# Patient Record
Sex: Male | Born: 1945 | Race: Black or African American | Hispanic: No | Marital: Married | Smoking: Former smoker
Health system: Southern US, Community
[De-identification: ages and names within clinical notes are randomized; demographics above are authoritative.]

## PROBLEM LIST (undated history)

## (undated) DIAGNOSIS — T7840XA Allergy, unspecified, initial encounter: Secondary | ICD-10-CM

## (undated) DIAGNOSIS — J302 Other seasonal allergic rhinitis: Secondary | ICD-10-CM

## (undated) DIAGNOSIS — K219 Gastro-esophageal reflux disease without esophagitis: Secondary | ICD-10-CM

## (undated) DIAGNOSIS — K635 Polyp of colon: Secondary | ICD-10-CM

## (undated) DIAGNOSIS — N529 Male erectile dysfunction, unspecified: Secondary | ICD-10-CM

## (undated) HISTORY — DX: Male erectile dysfunction, unspecified: N52.9

## (undated) HISTORY — DX: Gastro-esophageal reflux disease without esophagitis: K21.9

## (undated) HISTORY — DX: Allergy, unspecified, initial encounter: T78.40XA

## (undated) HISTORY — DX: Other seasonal allergic rhinitis: J30.2

## (undated) HISTORY — DX: Polyp of colon: K63.5

## (undated) HISTORY — PX: COLONOSCOPY: SHX174

## (undated) HISTORY — PX: OTHER SURGICAL HISTORY: SHX169

---

## 2004-10-28 ENCOUNTER — Encounter: Admission: RE | Admit: 2004-10-28 | Discharge: 2004-11-20 | Payer: Self-pay | Admitting: Neurosurgery

## 2008-09-07 ENCOUNTER — Ambulatory Visit: Payer: Self-pay | Admitting: Gastroenterology

## 2008-09-19 ENCOUNTER — Encounter: Payer: Self-pay | Admitting: Gastroenterology

## 2008-09-19 ENCOUNTER — Ambulatory Visit: Payer: Self-pay | Admitting: Gastroenterology

## 2008-09-24 ENCOUNTER — Encounter: Payer: Self-pay | Admitting: Gastroenterology

## 2010-06-19 ENCOUNTER — Encounter: Payer: Self-pay | Admitting: Pulmonary Disease

## 2010-06-19 ENCOUNTER — Ambulatory Visit (INDEPENDENT_AMBULATORY_CARE_PROVIDER_SITE_OTHER): Payer: 59 | Admitting: Pulmonary Disease

## 2010-06-19 VITALS — BP 124/90 | HR 72 | Temp 98.2°F | Ht 68.0 in | Wt 176.6 lb

## 2010-06-19 DIAGNOSIS — R05 Cough: Secondary | ICD-10-CM | POA: Insufficient documentation

## 2010-06-19 NOTE — Progress Notes (Signed)
Subjective:    Patient ID: Brian Mueller, male    DOB: 03/21/1945, 65 y.o.   MRN: [id number]  HPI 65 yo male with cough.  His cough started in February.  He had a cold at that time.  He never had a problem like this before.  He was treated with an antibiotic and cough medicine.  This helped initially.  His cough persisted.  He was then given another antibiotic and cough medicine in March.  He was scheduled for pulmonary evaluation.  He is not using anything for his cough now.  His cough has improved, but he want to keep his appointment to make sure nothing else was contributing to his cough.  He has a history of allergies. He was previously on allergy shots.  He uses flonase and allegra on a daily basis for several years.  There is no prior history of asthma.  He allergies are worse in Spring, and he was told he has allergies to trees/dust mites/grass.  He denies recent fever or sweats.  There is no history of hemoptysis or chest pain.  He has not had wheezing.  He denies sore throat, dysphagia, abdominal pain, leg swelling, skin rash, or gland swelling.  He works as a Runner, broadcasting/film/video.  He quit smoking 15 yrs ago.  He smoked 1/2 pack per day for 6 years.  There is no history of pneumonia or TB.  He denies sick exposures.  He is from [place], and denies recent travel history.  He denies animal exposures.  He typically gets sinus congestion in the Fall and Spring from his allergies.  He does have a history of reflux, and takes prilosec on a regular basis.  Chest xray from 04/25/10 showed no acute disease process.  Past Medical History  Diagnosis Date  . Seasonal allergies   . GERD (gastroesophageal reflux disease)   . Erectile dysfunction   . Colon polyp      Family History  Problem Relation Age of Onset  . Heart disease Father   . Allergies Father      History   Social History  . Marital Status: Divorced    Spouse Name: N/A    Number of Children: N/A  . Years of Education: N/A    Occupational History  . 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 retired     [employer]   Social History Main Topics  . Smoking status: Former Smoker -- 0.5 packs/day for 6 years    Types: Cigarettes    Quit date: 02/24/1995  . Smokeless tobacco: Not on file  . Alcohol Use: Yes     occasional 1 time a month  . Drug Use: No  . Sexually Active: Not on file   Other Topics Concern  . Not on file   Social History Narrative  . No narrative on file     No Known Allergies   No outpatient prescriptions prior to visit.      Review of Systems  Respiratory: Positive for cough.        Objective:   Physical Exam Filed Vitals:   06/19/10 1111 06/19/10 1112  BP:  124/90  Pulse:  72  Temp: 98.2 F (36.8 C)   TempSrc: Oral   Height: 5\' 8"  (1.727 m)   Weight: 176 lb 9.6 oz (80.105 kg)   SpO2:  98%   General - healthy, no distress HEENT - PERRLA, EOMI, no sinus tenderness, clear sinus drainage,  no oral lesion, no LAN, no thyromegaly Cardiac - s1s2 regular, no murmur, pulses symmetric Chest - CTA Abd - thin, soft, nontender Ext - no E/C/C Neuro - normal strength, CN intact, A&O x 2 Psych - normal mood/behavior Skin - no rash       Assessment & Plan:   Cough He has insignificant history of smoking and recent chest xray was normal.  He has a history of seasonal allergies with rhinitis, and this was likely exacerbated by recent upper respiratory infection.  He also has a history of GERD, but this is controlled with medication.  He could possibly have asthma.  He has recent symptomatic improvement.  He is to continue his allergy and sinus regimen, and continue his anti-reflux medicine.  Will arrange for pulmonary function testing.  If this is unremarkable, then he can follow up with pulmonary as needed.    Updated Medication List Outpatient Encounter Prescriptions as of 06/19/2010  Medication Sig Dispense Refill  . aspirin 81 MG tablet Take 81  mg by mouth daily.        . fexofenadine (ALLEGRA) 180 MG tablet Take 180 mg by mouth daily.        . fish oil-omega-3 fatty acids 1000 MG capsule 1 capsule twice a day       . fluticasone (FLONASE) 50 MCG/ACT nasal spray 2 sprays by Nasal route daily.        . Multiple Vitamin (MULTIVITAMIN) capsule Take 1 capsule by mouth daily.        Marland Kitchen omeprazole (PRILOSEC OTC) 20 MG tablet 1 capsule every other day       . vardenafil (LEVITRA) 20 MG tablet Take 20 mg by mouth daily as needed.

## 2010-06-19 NOTE — Assessment & Plan Note (Signed)
He has insignificant history of smoking and recent chest xray was normal.  He has a history of seasonal allergies with rhinitis, and this was likely exacerbated by recent upper respiratory infection.  He also has a history of GERD, but this is controlled with medication.  He could possibly have asthma.  He has recent symptomatic improvement.  He is to continue his allergy and sinus regimen, and continue his anti-reflux medicine.  Will arrange for pulmonary function testing.  If this is unremarkable, then he can follow up with pulmonary as needed.

## 2010-06-19 NOTE — Patient Instructions (Signed)
Will schedule breathing test (PFT) and call with results Follow up with pulmonary as needed

## 2010-06-27 ENCOUNTER — Ambulatory Visit (INDEPENDENT_AMBULATORY_CARE_PROVIDER_SITE_OTHER): Payer: 59 | Admitting: Pulmonary Disease

## 2010-06-27 DIAGNOSIS — R05 Cough: Secondary | ICD-10-CM

## 2010-06-27 LAB — PULMONARY FUNCTION TEST

## 2010-06-27 NOTE — Progress Notes (Signed)
PFT done today. 

## 2010-07-04 ENCOUNTER — Encounter: Payer: Self-pay | Admitting: Pulmonary Disease

## 2010-07-23 ENCOUNTER — Telehealth: Payer: Self-pay | Admitting: Pulmonary Disease

## 2010-07-23 ENCOUNTER — Encounter: Payer: Self-pay | Admitting: Pulmonary Disease

## 2010-07-23 NOTE — Telephone Encounter (Signed)
Pulmonary function test reviewed, and normal.  Will have my nurse inform patient that breathing test was normal.  He can follow up with pulmonary as needed if he develops breathing troubles again.

## 2010-07-24 NOTE — Telephone Encounter (Signed)
[email or web address] x1 

## 2010-07-24 NOTE — Telephone Encounter (Signed)
Spoke with pt and notified of results per Dr. Sood. Pt verbalized understanding and denied any questions.  

## 2010-07-29 ENCOUNTER — Encounter: Payer: Self-pay | Admitting: Pulmonary Disease

## 2010-11-14 ENCOUNTER — Other Ambulatory Visit: Payer: Self-pay | Admitting: Family Medicine

## 2012-12-12 ENCOUNTER — Ambulatory Visit: Payer: Medicare Other

## 2012-12-12 ENCOUNTER — Ambulatory Visit (INDEPENDENT_AMBULATORY_CARE_PROVIDER_SITE_OTHER): Payer: Medicare Other | Admitting: Emergency Medicine

## 2012-12-12 VITALS — BP 120/80 | HR 75 | Temp 98.5°F | Resp 16 | Ht 67.25 in | Wt 171.6 lb

## 2012-12-12 DIAGNOSIS — N529 Male erectile dysfunction, unspecified: Secondary | ICD-10-CM

## 2012-12-12 DIAGNOSIS — R05 Cough: Secondary | ICD-10-CM

## 2012-12-12 DIAGNOSIS — J309 Allergic rhinitis, unspecified: Secondary | ICD-10-CM

## 2012-12-12 MED ORDER — FLUTICASONE PROPIONATE 50 MCG/ACT NA SUSP: 2.0000 | Freq: Every day | NASAL | Status: DC

## 2012-12-12 MED ORDER — VARDENAFIL HCL 20 MG PO TABS: 10.0000 mg | ORAL_TABLET | Freq: Every day | ORAL | Status: DC | PRN

## 2012-12-12 MED ORDER — OMEPRAZOLE 20 MG PO CPDR: 20.0000 mg | DELAYED_RELEASE_CAPSULE | Freq: Every day | ORAL | Status: DC

## 2012-12-12 MED ORDER — BENZONATATE 100 MG PO CAPS: 100.0000 mg | ORAL_CAPSULE | Freq: Three times a day (TID) | ORAL | Status: DC | PRN

## 2012-12-12 NOTE — Patient Instructions (Signed)
Take your prilosec daily Use the cough tablets Call if not improved Come back in if worse Schedule appointment for your physical

## 2012-12-12 NOTE — Progress Notes (Signed)
  Subjective:    Patient ID: Brian Mueller, male    DOB: 1945/12/14, 67 y.o.   MRN: [id number]  HPI 67 year old male who complains of dry cough for 3 weeks and nasal congestion.  No fever. No bad reflux, but takes prilosec 2 to 3 times a week.  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 Patient also requesting a refill on his Flonase and a refill on his Levitra. He takes Prilosec 2 times a week for reflux symptoms   Review of Systems     Objective:   Physical Exam HE exam is unremarkable. Neck is supple. Chest is clear to auscultation percussion. Heart regular rate no murmurs or gallops. Abdomen is soft liver and spleen not large there are no masses.  [facility] reading (PRIMARY) by  Dr. Cleta Alberts minimal increase in interstitial markings no acute disease seen       Assessment & Plan:  His Prilosec Levitra Flonase refilled. He is to take his Prilosec every day. He was also given a prescription for [employer].

## 2013-04-03 ENCOUNTER — Other Ambulatory Visit: Payer: Self-pay | Admitting: Emergency Medicine

## 2013-08-03 ENCOUNTER — Other Ambulatory Visit: Payer: Self-pay | Admitting: Emergency Medicine

## 2013-09-12 ENCOUNTER — Other Ambulatory Visit: Payer: Self-pay | Admitting: Emergency Medicine

## 2013-09-18 ENCOUNTER — Other Ambulatory Visit: Payer: Self-pay | Admitting: Emergency Medicine

## 2013-09-22 ENCOUNTER — Encounter: Payer: Self-pay | Admitting: Gastroenterology

## 2013-12-12 IMAGING — CR DG CHEST 2V
2 series · 2 of 2 positions shown · non-contrast
Comparison: April 25, 2010.

CLINICAL DATA: Cough.

EXAM:
CHEST  2 VIEW

[PA]
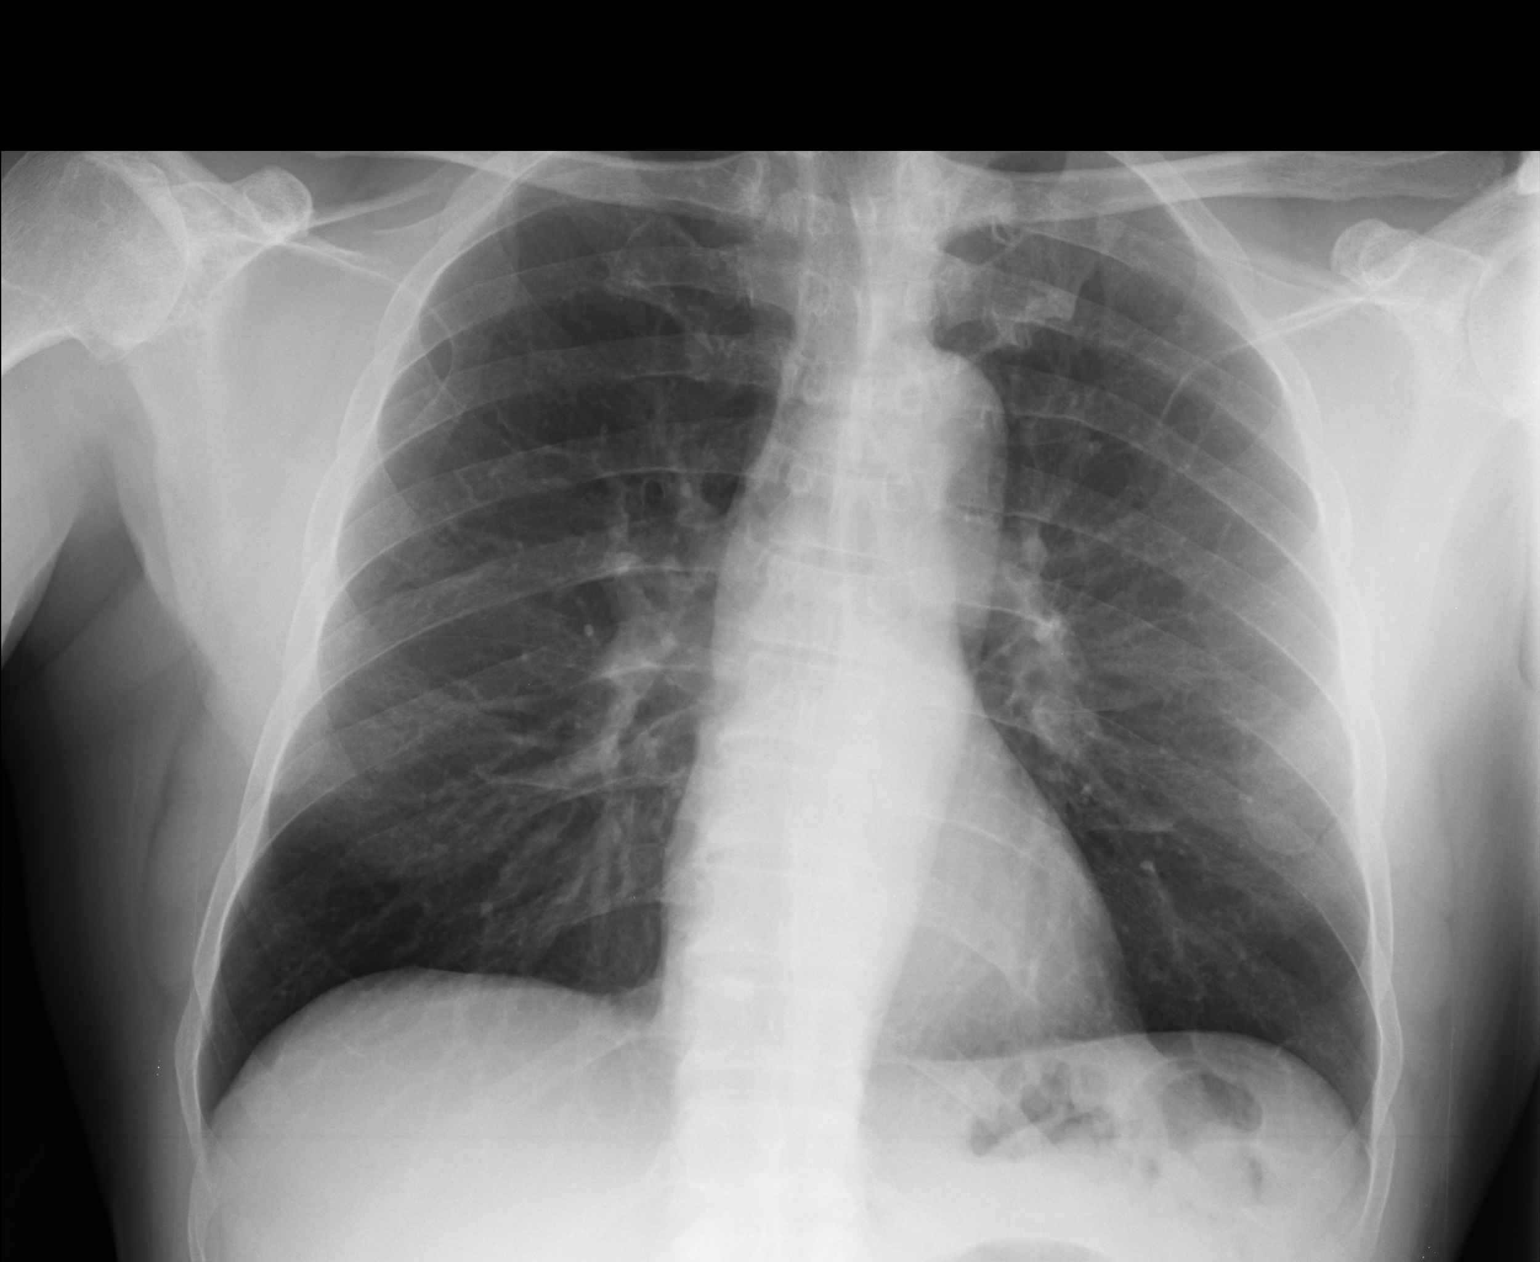

[lateral]
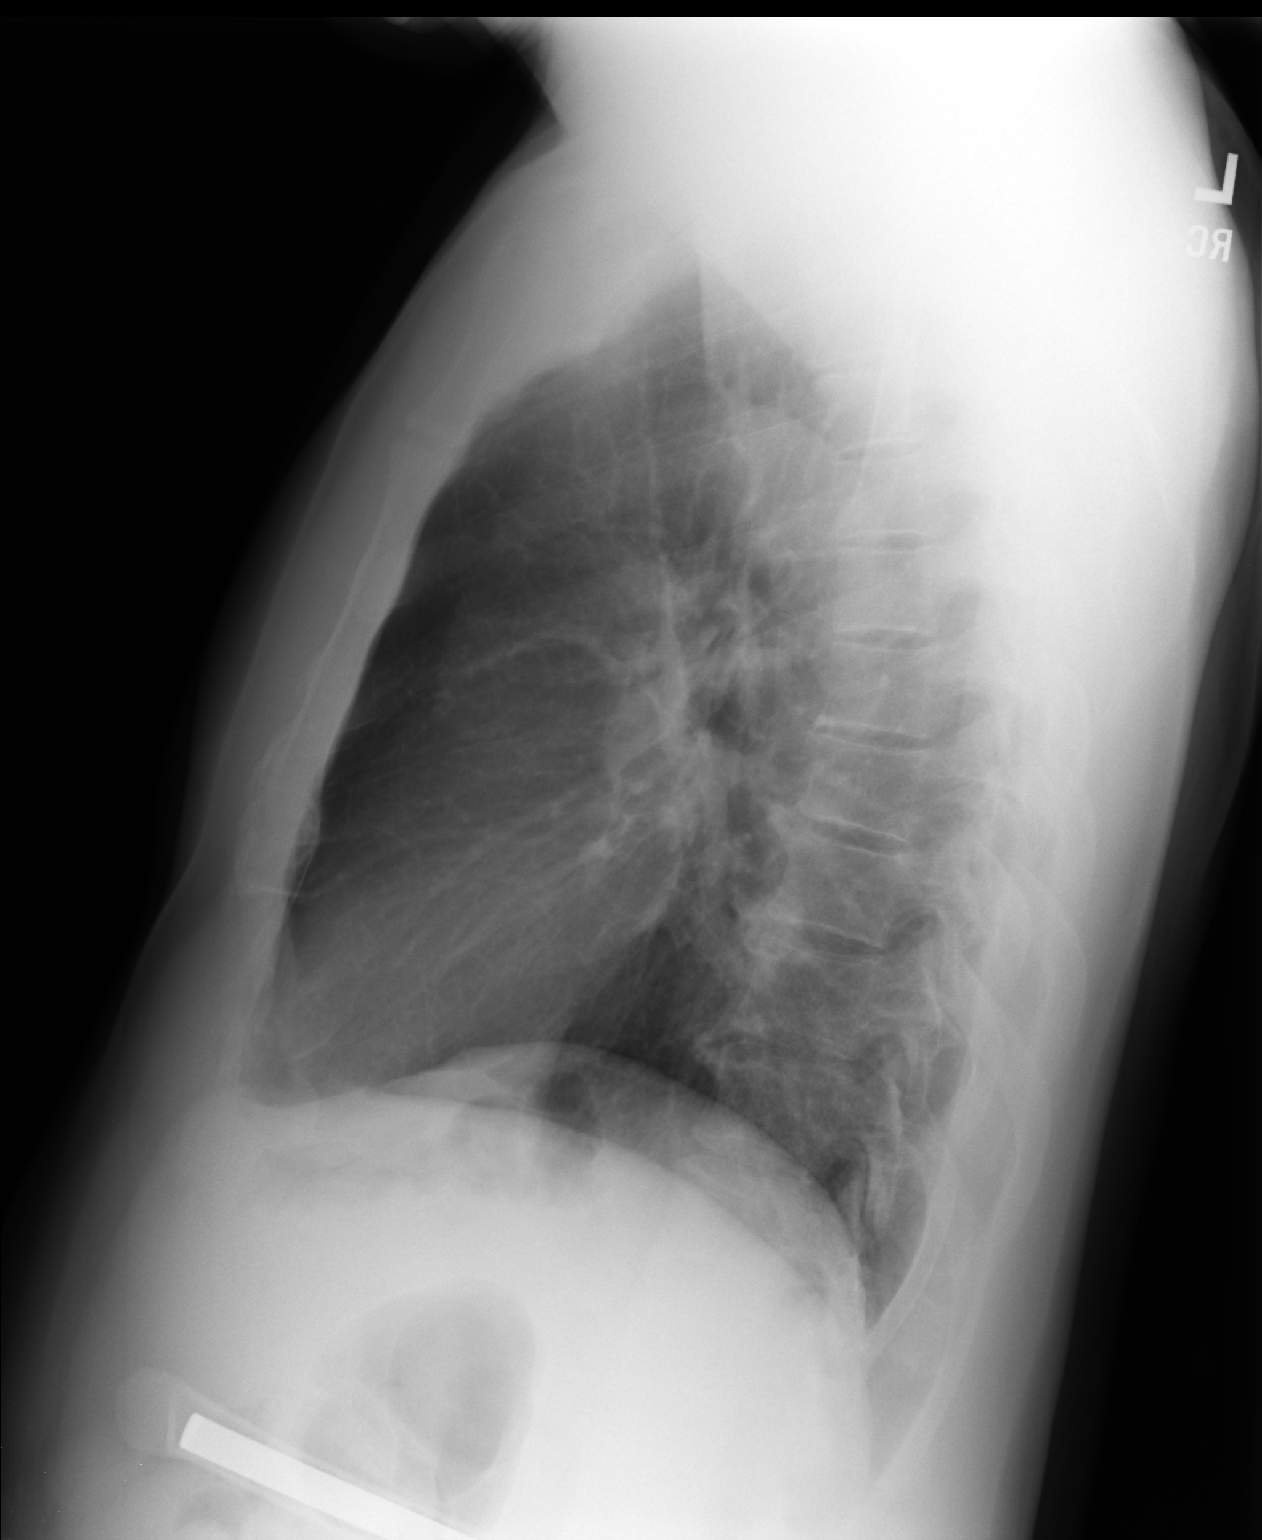

[2 of 2 positions shown; findings below may reference images not displayed]

FINDINGS: The heart size and mediastinal contours are within normal limits.
Both lungs are clear. The visualized skeletal structures are
unremarkable.
IMPRESSION: No active cardiopulmonary disease.

## 2014-01-07 ENCOUNTER — Other Ambulatory Visit: Payer: Self-pay | Admitting: Emergency Medicine

## 2014-02-05 ENCOUNTER — Other Ambulatory Visit: Payer: Self-pay | Admitting: Physician Assistant

## 2014-04-03 ENCOUNTER — Other Ambulatory Visit: Payer: Self-pay | Admitting: Emergency Medicine

## 2014-05-08 ENCOUNTER — Ambulatory Visit (INDEPENDENT_AMBULATORY_CARE_PROVIDER_SITE_OTHER): Payer: Medicare Other | Admitting: Family Medicine

## 2014-05-08 VITALS — BP 118/96 | HR 63 | Temp 97.9°F | Resp 16 | Ht 67.0 in | Wt 180.0 lb

## 2014-05-08 DIAGNOSIS — J309 Allergic rhinitis, unspecified: Secondary | ICD-10-CM | POA: Diagnosis not present

## 2014-05-08 DIAGNOSIS — N529 Male erectile dysfunction, unspecified: Secondary | ICD-10-CM | POA: Diagnosis not present

## 2014-05-08 MED ORDER — FLUTICASONE PROPIONATE 50 MCG/ACT NA SUSP: NASAL | Status: DC

## 2014-05-08 MED ORDER — VARDENAFIL HCL 20 MG PO TABS: 10.0000 mg | ORAL_TABLET | Freq: Every day | ORAL | Status: DC | PRN

## 2014-05-08 NOTE — Patient Instructions (Addendum)
Try flonase up to 2 sprays per nostril each day - lowest effective dose.  Lowest effective dose of Levitra.  If any new side effects, avoid this medicine and return to discuss.  We will call you to establish care and physical with primary provider here.    Allergic Rhinitis Allergic rhinitis is when the mucous membranes in the nose respond to allergens. Allergens are particles in the air that cause your body to have an allergic reaction. This causes you to release allergic antibodies. Through a chain of events, these eventually cause you to release histamine into the blood stream. Although meant to protect the body, it is this release of histamine that causes your discomfort, such as frequent sneezing, congestion, and an itchy, runny nose.  CAUSES  Seasonal allergic rhinitis (hay fever) is caused by pollen allergens that may come from grasses, trees, and weeds. Year-round allergic rhinitis (perennial allergic rhinitis) is caused by allergens such as house dust mites, pet dander, and mold spores.  SYMPTOMS   Nasal stuffiness (congestion).  Itchy, runny nose with sneezing and tearing of the eyes. DIAGNOSIS  Your health care provider can help you determine the allergen or allergens that trigger your symptoms. If you and your health care provider are unable to determine the allergen, skin or blood testing may be used. TREATMENT  Allergic rhinitis does not have a cure, but it can be controlled by:  Medicines and allergy shots (immunotherapy).  Avoiding the allergen. Hay fever may often be treated with antihistamines in pill or nasal spray forms. Antihistamines block the effects of histamine. There are over-the-counter medicines that may help with nasal congestion and swelling around the eyes. Check with your health care provider before taking or giving this medicine.  If avoiding the allergen or the medicine prescribed do not work, there are many new medicines your health care provider can  prescribe. Stronger medicine may be used if initial measures are ineffective. Desensitizing injections can be used if medicine and avoidance does not work. Desensitization is when a patient is given ongoing shots until the body becomes less sensitive to the allergen. Make sure you follow up with your health care provider if problems continue. HOME CARE INSTRUCTIONS It is not possible to completely avoid allergens, but you can reduce your symptoms by taking steps to limit your exposure to them. It helps to know exactly what you are allergic to so that you can avoid your specific triggers. SEEK MEDICAL CARE IF:   You have a fever.  You develop a cough that does not stop easily (persistent).  You have shortness of breath.  You start wheezing.  Symptoms interfere with normal daily activities. Document Released: 11/04/2000 Document Revised: 02/14/2013 Document Reviewed: 10/17/2012 [facility] Patient Information 2015 [place], [place]. This information is not intended to replace advice given to you by your health care provider. Make sure you discuss any questions you have with your health care provider.

## 2014-05-08 NOTE — Progress Notes (Signed)
Subjective:   This chart was scribed for Brian Ray, Mueller by Brian Mueller, Medical Scribe. This patient was seen in Room 14  and the patient's care was started at 5:48 PM.   Patient ID: Brian Mueller, male    DOB: 21-Aug-1945, 69 y.o.   MRN: [id number]  Chief Complaint  Patient presents with  . Medication Refill    need Flonase and Levitra    HPI Brian Mueller is a 69 y.o. male who is here for a med refill of his nasal spray and Levitra. He was last seen in October 2014 by Dr. Everlene Mueller.  1. Allergic rhinitis Pt has seasonal allergies for which he takes OTC allegra and Flonase nasal spray. Pt states he normally does 1-2 sprays per nostril depending of the severity of symptoms.   2. Erectile dysfunction Pt takes Levitra 20 mg tablets and states he splits them in half. He states he has not taken his Levitra medication in 2 months. He denies any side effects from the Levitra medication. He denies any HA, chest pain, lightheadedness, dizziness, vision changes, color changes to vision, or hearing loss. Pt denies any h/o heart disease. He states his father was around 9 when he began having heart issues. He denies any early onset heart disease or cardiac issues in his family.   PCP: previously seeing Brian Mueller, but Dr. Baldemar Mueller moved so he no longer has a PCP  Patient Active Problem List   Diagnosis Date Noted  . Cough 06/19/2010   Past Medical History  Diagnosis Date  . Seasonal allergies   . GERD (gastroesophageal reflux disease)   . Erectile dysfunction   . Colon polyp    No past surgical history on file. No Known Allergies Prior to Admission medications   Medication Sig Start Date End Date Taking? Authorizing Provider  aspirin 81 MG tablet Take 81 mg by mouth daily.     Yes Historical Provider, Mueller  fexofenadine (ALLEGRA) 180 MG tablet Take 180 mg by mouth daily.     Yes Historical Provider, Mueller  fish oil-omega-3 fatty acids 1000 MG capsule 1 capsule twice a day    Yes  Historical Provider, Mueller  fluticasone (FLONASE) 50 MCG/ACT nasal spray Place 2 sprays into the nose daily.   "needs office visit for additional refills" 01/08/14  Yes Brian Mueller  LEVITRA 20 MG tablet TAKE 1/2 TO 1 TABLET BY MOUTH EVERY DAY AS NEEDED FOR ERECTILE DYSFUNCTION   Yes Brian Mueller  Multiple Vitamin (MULTIVITAMIN) capsule Take 1 capsule by mouth daily.     Yes Historical Provider, Mueller  omeprazole (PRILOSEC OTC) 20 MG tablet 1 capsule every other day    Yes Historical Provider, Mueller   History   Social History  . Marital Status: Married    Spouse Name: N/A  . Number of Children: N/A  . Years of Education: N/A   Occupational History  . 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 Smoking status: Former Smoker -- 0.50 packs/day for 6 years    Types: Cigarettes    Quit date: 02/24/1995  . Smokeless tobacco: Not on file  . Alcohol Use: Yes     Comment: occasional 1 time a month  . Drug Use: No  . Sexual Activity: Not on file   Other Topics Concern  . Not on file  Social History Narrative    Review of Systems  Constitutional: Negative for fatigue and unexpected weight change.  HENT: Negative for hearing loss.   Eyes: Negative for visual disturbance.  Respiratory: Negative for cough, chest tightness and shortness of breath.   Cardiovascular: Negative for chest pain, palpitations and leg swelling.  Gastrointestinal: Negative for abdominal pain and blood in stool.  Neurological: Negative for dizziness, light-headedness and headaches.  All other systems reviewed and are negative.      Objective:   Physical Exam  Constitutional: He is oriented to person, place, and time. He appears well-developed and well-nourished. No distress.  HENT:  Head: Normocephalic and atraumatic.  Right Ear: Tympanic membrane, external ear and ear canal normal.  Left Ear: Tympanic membrane, external ear  and ear canal normal.  Nose: No rhinorrhea.  Mouth/Throat: Oropharynx is clear and moist and mucous membranes are normal. No oropharyngeal exudate or posterior oropharyngeal erythema.  Edematous turbinates bilaterally  Eyes: Conjunctivae and EOM are normal. Pupils are equal, round, and reactive to light.  Neck: Neck supple. No JVD present. Carotid bruit is not present. No tracheal deviation present.  Cardiovascular: Normal rate, regular rhythm, normal heart sounds and intact distal pulses.   No murmur heard. Pulmonary/Chest: Effort normal and breath sounds normal. No respiratory distress. He has no wheezes. He has no rhonchi. He has no rales.  Abdominal: Soft. There is no tenderness.  Musculoskeletal: Normal range of motion. He exhibits no edema.  Lymphadenopathy:    He has no cervical adenopathy.  Neurological: He is alert and oriented to person, place, and time.  Skin: Skin is warm and dry. No rash noted.  Psychiatric: He has a normal mood and affect. His behavior is normal.  Nursing note and vitals reviewed.    Filed Vitals:   05/08/14 1738  BP: 118/96  Pulse: 63  Temp: 97.9 F (36.6 C)  TempSrc: Oral  Resp: 16  Height: 5\' 7"  (1.702 m)  Weight: 180 lb (81.647 kg)  SpO2: 97%     Assessment & Plan:   Brian Mueller is a 69 y.o. male Allergic rhinitis, unspecified allergic rhinitis type - Plan: fluticasone (FLONASE) 50 MCG/ACT nasal spray  -flonase refilled. Sx care discussed.   Erectile dysfunction, unspecified erectile dysfunction type - Plan: vardenafil (LEVITRA) 20 MG tablet  - levitra refilled. Discussed possible side effects, including possible vascular "steal syndrome" if unknown underlying CAD, or possible irreversible sensorineural hearing loss. Understanding expressed.   Will schedule physical/establish care with [facility] provider by appt.    Meds ordered this encounter  Medications  . fluticasone (FLONASE) 50 MCG/ACT nasal spray    Sig: Place 2 sprays into the nose  daily.    Dispense:  16 g    Refill:  6  . vardenafil (LEVITRA) 20 MG tablet    Sig: Take 0.5-1 tablets (10-20 mg total) by mouth daily as needed. Prior to sexual activity.    Dispense:  10 tablet    Refill:  2   Patient Instructions  Try flonase up to 2 sprays per nostril each day - lowest effective dose.  Lowest effective dose of Levitra.  If any new side effects, avoid this medicine and return to discuss.  We will call you to establish care and physical with primary provider here.    Allergic Rhinitis Allergic rhinitis is when the mucous membranes in the nose respond to allergens. Allergens are particles in the air that cause your body to have an allergic reaction. This causes  you to release allergic antibodies. Through a chain of events, these eventually cause you to release histamine into the blood stream. Although meant to protect the body, it is this release of histamine that causes your discomfort, such as frequent sneezing, congestion, and an itchy, runny nose.  CAUSES  Seasonal allergic rhinitis (hay fever) is caused by pollen allergens that may come from grasses, trees, and weeds. Year-round allergic rhinitis (perennial allergic rhinitis) is caused by allergens such as house dust mites, pet dander, and mold spores.  SYMPTOMS   Nasal stuffiness (congestion).  Itchy, runny nose with sneezing and tearing of the eyes. DIAGNOSIS  Your health care provider can help you determine the allergen or allergens that trigger your symptoms. If you and your health care provider are unable to determine the allergen, skin or blood testing may be used. TREATMENT  Allergic rhinitis does not have a cure, but it can be controlled by:  Medicines and allergy shots (immunotherapy).  Avoiding the allergen. Hay fever may often be treated with antihistamines in pill or nasal spray forms. Antihistamines block the effects of histamine. There are over-the-counter medicines that may help with nasal  congestion and swelling around the eyes. Check with your health care provider before taking or giving this medicine.  If avoiding the allergen or the medicine prescribed do not work, there are many new medicines your health care provider can prescribe. Stronger medicine may be used if initial measures are ineffective. Desensitizing injections can be used if medicine and avoidance does not work. Desensitization is when a patient is given ongoing shots until the body becomes less sensitive to the allergen. Make sure you follow up with your health care provider if problems continue. HOME CARE INSTRUCTIONS It is not possible to completely avoid allergens, but you can reduce your symptoms by taking steps to limit your exposure to them. It helps to know exactly what you are allergic to so that you can avoid your specific triggers. SEEK MEDICAL CARE IF:   You have a fever.  You develop a cough that does not stop easily (persistent).  You have shortness of breath.  You start wheezing.  Symptoms interfere with normal daily activities. Document Released: 11/04/2000 Document Revised: 02/14/2013 Document Reviewed: 10/17/2012 [facility] Patient Information 2015 [place], [place]. This information is not intended to replace advice given to you by your health care provider. Make sure you discuss any questions you have with your health care provider.   I personally performed the services described in this documentation, which was scribed in my presence. The recorded information has been reviewed and considered, and addended by me as needed.

## 2014-05-23 ENCOUNTER — Encounter: Payer: Self-pay | Admitting: Family Medicine

## 2014-05-23 NOTE — Progress Notes (Signed)
lmom to call us back about making an appt for a CPE i will be sending him a letter

## 2014-06-23 ENCOUNTER — Ambulatory Visit (INDEPENDENT_AMBULATORY_CARE_PROVIDER_SITE_OTHER): Payer: Medicare Other | Admitting: Emergency Medicine

## 2014-06-23 ENCOUNTER — Ambulatory Visit (INDEPENDENT_AMBULATORY_CARE_PROVIDER_SITE_OTHER): Payer: Medicare Other

## 2014-06-23 VITALS — BP 120/84 | HR 80 | Temp 97.8°F | Ht 67.0 in | Wt 172.1 lb

## 2014-06-23 DIAGNOSIS — R059 Cough, unspecified: Secondary | ICD-10-CM

## 2014-06-23 DIAGNOSIS — R05 Cough: Secondary | ICD-10-CM | POA: Diagnosis not present

## 2014-06-23 DIAGNOSIS — J40 Bronchitis, not specified as acute or chronic: Secondary | ICD-10-CM | POA: Diagnosis not present

## 2014-06-23 DIAGNOSIS — K219 Gastro-esophageal reflux disease without esophagitis: Secondary | ICD-10-CM | POA: Diagnosis not present

## 2014-06-23 MED ORDER — ESOMEPRAZOLE MAGNESIUM 40 MG PO CPDR: 40.0000 mg | DELAYED_RELEASE_CAPSULE | Freq: Every day | ORAL | Status: DC

## 2014-06-23 MED ORDER — HYDROCOD POLST-CPM POLST ER 10-8 MG/5ML PO SUER: 5.0000 mL | Freq: Two times a day (BID) | ORAL | Status: DC

## 2014-06-23 MED ORDER — CLARITHROMYCIN 500 MG PO TABS: 500.0000 mg | ORAL_TABLET | Freq: Two times a day (BID) | ORAL | Status: DC

## 2014-06-23 NOTE — Progress Notes (Signed)
[facility] [address], [place] [address] [phone]  Date:  06/23/2014   Name:  Brian Mueller   DOB:  November 16, 1945   MRN:  [id number]  PCP:  Marcello Fennel, MD    Chief Complaint: Cough   History of Present Illness:  Brian Mueller is a 69 y.o. very pleasant male patient who presents with the following:  Ill for two weeks with cough.  Not productive. No wheezing or shortness of breath No nasal congestion Some post nasal drip Cough worse at night. No fever or chills No malaise or fatigue No nausea or vomiting No stool change No improvement with over the counter medications or other home remedies.  Denies other complaint or health concern today.   Patient Active Problem List   Diagnosis Date Noted  . Cough 06/19/2010    Past Medical History  Diagnosis Date  . Seasonal allergies   . GERD (gastroesophageal reflux disease)   . Erectile dysfunction   . Colon polyp     No past surgical history on file.  History  Substance Use Topics  . Smoking status: Former Smoker -- 0.50 packs/day for 6 years    Types: Cigarettes    Quit date: 02/24/1995  . Smokeless tobacco: Not on file  . Alcohol Use: 0.0 oz/week    0 Standard drinks or equivalent per week     Comment: occasional 1 time a month    Family History  Problem Relation Age of Onset  . Heart disease Father   . Allergies Father     No Known Allergies  Medication list has been reviewed and updated.  Current Outpatient Prescriptions on File Prior to Visit  Medication Sig Dispense Refill  . aspirin 81 MG tablet Take 81 mg by mouth daily.      . fexofenadine (ALLEGRA) 180 MG tablet Take 180 mg by mouth daily.      . fish oil-omega-3 fatty acids 1000 MG capsule 1 capsule twice a day     . fluticasone (FLONASE) 50 MCG/ACT nasal spray Place 2 sprays into the nose daily. 16 g 6  . Multiple Vitamin (MULTIVITAMIN) capsule Take 1 capsule by mouth daily.      Marland Kitchen omeprazole (PRILOSEC OTC) 20 MG  tablet 1 capsule every other day     . vardenafil (LEVITRA) 20 MG tablet Take 0.5-1 tablets (10-20 mg total) by mouth daily as needed. Prior to sexual activity. 10 tablet 2   No current facility-administered medications on file prior to visit.    Review of Systems:  Review of Systems  Constitutional: Negative for fever, chills and fatigue.  HENT: Negative for congestion, ear pain, hearing loss, postnasal drip, rhinorrhea and sinus pressure.   Eyes: Negative for discharge and redness.  Respiratory: Negative for  shortness of breath and wheezing.   Cardiovascular: Negative for chest pain and leg swelling.  Gastrointestinal: Negative for nausea, vomiting, abdominal pain, constipation and blood in stool.  Genitourinary: Negative for dysuria, urgency and frequency.  Musculoskeletal: Negative for neck stiffness.  Skin: Negative for rash.  Neurological: Negative for seizures, weakness and headaches.     Physical Examination: Filed Vitals:   06/23/14 1212  BP: 120/84  Pulse: 80  Temp: 97.8 F (36.6 C)   Filed Vitals:   06/23/14 1212  Height: 5\' 7"  (1.702 m)  Weight: 172 lb 2 oz (78.075 kg)   Body mass index is 26.95 kg/(m^2). Ideal Body Weight: Weight in (lb) to have BMI =  25: 159.3  GEN: WDWN, NAD, Non-toxic, A & O x 3 HEENT: Atraumatic, Normocephalic. Neck supple. No masses, No LAD. Ears and Nose: No external deformity. CV: RRR, No M/G/R. No JVD. No thrill. No extra heart sounds. PULM: CTA B, no wheezes, crackles, rhonchi. No retractions. No resp. distress. No accessory muscle use. ABD: S, NT, ND, +BS. No rebound. No HSM. EXTR: No c/c/e NEURO Normal gait.  PSYCH: Normally interactive. Conversant. Not depressed or anxious appearing.  Calm demeanor.    Assessment and Plan: bronchtiis biaxin tussionex   Signed Ellison Carwin, MD   [facility] reading (PRIMARY) by  Dr. Ouida Sills.  negative.

## 2014-06-23 NOTE — Patient Instructions (Signed)

## 2014-07-04 ENCOUNTER — Telehealth: Payer: Self-pay

## 2014-07-04 ENCOUNTER — Other Ambulatory Visit: Payer: Self-pay | Admitting: Family Medicine

## 2014-07-04 DIAGNOSIS — R05 Cough: Secondary | ICD-10-CM

## 2014-07-04 DIAGNOSIS — R059 Cough, unspecified: Secondary | ICD-10-CM

## 2014-07-04 MED ORDER — BENZONATATE 200 MG PO CAPS: 200.0000 mg | ORAL_CAPSULE | Freq: Two times a day (BID) | ORAL | Status: DC | PRN

## 2014-07-04 NOTE — Telephone Encounter (Signed)
Please advise, Dr L.

## 2014-07-04 NOTE — Progress Notes (Signed)
Notified pt that tessalon perles sent in.

## 2014-07-04 NOTE — Telephone Encounter (Signed)
ANDERSON - Pt is requesting a refill on the hydrocodone cough syrup and would also like something he can take during the day for the cough.  He needs something that wont make him drowsy.  [phone]

## 2014-07-20 ENCOUNTER — Telehealth: Payer: Self-pay

## 2014-07-20 DIAGNOSIS — J309 Allergic rhinitis, unspecified: Secondary | ICD-10-CM

## 2014-07-20 MED ORDER — FLUTICASONE PROPIONATE 50 MCG/ACT NA SUSP: NASAL | Status: DC

## 2014-07-20 MED ORDER — ESOMEPRAZOLE MAGNESIUM 40 MG PO CPDR: 40.0000 mg | DELAYED_RELEASE_CAPSULE | Freq: Every day | ORAL | Status: DC

## 2014-07-20 NOTE — Telephone Encounter (Signed)
Pharm reqs 90 day supplies of meds. Done

## 2014-10-16 ENCOUNTER — Other Ambulatory Visit: Payer: Self-pay | Admitting: Emergency Medicine

## 2014-11-09 ENCOUNTER — Ambulatory Visit (INDEPENDENT_AMBULATORY_CARE_PROVIDER_SITE_OTHER): Payer: Medicare Other | Admitting: Family Medicine

## 2014-11-09 VITALS — BP 114/68 | HR 84 | Temp 98.5°F | Resp 17 | Ht 67.5 in | Wt 175.0 lb

## 2014-11-09 DIAGNOSIS — N529 Male erectile dysfunction, unspecified: Secondary | ICD-10-CM

## 2014-11-09 DIAGNOSIS — Z23 Encounter for immunization: Secondary | ICD-10-CM | POA: Diagnosis not present

## 2014-11-09 DIAGNOSIS — J309 Allergic rhinitis, unspecified: Secondary | ICD-10-CM | POA: Diagnosis not present

## 2014-11-09 DIAGNOSIS — H6122 Impacted cerumen, left ear: Secondary | ICD-10-CM | POA: Diagnosis not present

## 2014-11-09 MED ORDER — VARDENAFIL HCL 20 MG PO TABS: 10.0000 mg | ORAL_TABLET | Freq: Every day | ORAL | Status: DC | PRN

## 2014-11-09 MED ORDER — FLUTICASONE PROPIONATE 50 MCG/ACT NA SUSP: NASAL | Status: AC

## 2014-11-09 NOTE — Patient Instructions (Addendum)
You will receive the Prevnar 13 vaccination today. In one year you should get the Pneumovax 23.  In order to minimize earwax buildup, I recommend that every 3-4 months you use Debrox which you can buy over-the-counter to try and keep ears clean out prophylactically.  Continue the Levitra as needed  Influenza Virus Vaccine injection (Fluarix) What is this medicine? INFLUENZA VIRUS VACCINE (in floo EN zuh VAHY ruhs vak SEEN) helps to reduce the risk of getting influenza also known as the flu. This medicine may be used for other purposes; ask your health care provider or pharmacist if you have questions. COMMON BRAND NAME(S): Fluarix, Fluzone What should I tell my health care provider before I take this medicine? They need to know if you have any of these conditions: -bleeding disorder like hemophilia -fever or infection -Guillain-Barre syndrome or other neurological problems -immune system problems -infection with the human immunodeficiency virus (HIV) or AIDS -low blood platelet counts -multiple sclerosis -an unusual or allergic reaction to influenza virus vaccine, eggs, chicken proteins, latex, gentamicin, other medicines, foods, dyes or preservatives -pregnant or trying to get pregnant -breast-feeding How should I use this medicine? This vaccine is for injection into a muscle. It is given by a health care professional. A copy of Vaccine Information Statements will be given before each vaccination. Read this sheet carefully each time. The sheet may change frequently. Talk to your pediatrician regarding the use of this medicine in children. Special care may be needed. Overdosage: If you think you have taken too much of this medicine contact a poison control center or emergency room at once. NOTE: This medicine is only for you. Do not share this medicine with others. What if I miss a dose? This does not apply. What may interact with this medicine? -chemotherapy or radiation  therapy -medicines that lower your immune system like etanercept, anakinra, infliximab, and adalimumab -medicines that treat or prevent blood clots like warfarin -phenytoin -steroid medicines like prednisone or cortisone -theophylline -vaccines This list may not describe all possible interactions. Give your health care provider a list of all the medicines, herbs, non-prescription drugs, or dietary supplements you use. Also tell them if you smoke, drink alcohol, or use illegal drugs. Some items may interact with your medicine. What should I watch for while using this medicine? Report any side effects that do not go away within 3 days to your doctor or health care professional. Call your health care provider if any unusual symptoms occur within 6 weeks of receiving this vaccine. You may still catch the flu, but the illness is not usually as bad. You cannot get the flu from the vaccine. The vaccine will not protect against colds or other illnesses that may cause fever. The vaccine is needed every year. What side effects may I notice from receiving this medicine? Side effects that you should report to your doctor or health care professional as soon as possible: -allergic reactions like skin rash, itching or hives, swelling of the face, lips, or tongue Side effects that usually do not require medical attention (report to your doctor or health care professional if they continue or are bothersome): -fever -headache -muscle aches and pains -pain, tenderness, redness, or swelling at site where injected -weak or tired This list may not describe all possible side effects. Call your doctor for medical advice about side effects. You may report side effects to FDA at 1-800-FDA-1088. Where should I keep my medicine? This vaccine is only given in a clinic, pharmacy, doctor's  office, or other health care setting and will not be stored at home. NOTE: This sheet is a summary. It may not cover all possible  information. If you have questions about this medicine, talk to your doctor, pharmacist, or health care provider.  2015, Elsevier/Gold Standard. (2007-09-07 09:30:40) Pneumococcal Vaccine, Polyvalent suspension for injection What is this medicine? PNEUMOCOCCAL VACCINE, POLYVALENT (NEU mo KOK al vak SEEN, pol ee VEY luhnt) is a vaccine to prevent pneumococcus bacteria infection. These bacteria are a major cause of ear infections, 'Strep throat' infections, and serious pneumonia, meningitis, or blood infections worldwide. These vaccines help the body to produce antibodies (protective substances) that help your body defend against these bacteria. This vaccine is recommended for infants and young children. This vaccine will not treat an infection. This medicine may be used for other purposes; ask your health care provider or pharmacist if you have questions. COMMON BRAND NAME(S): Prevnar 13 What should I tell my health care provider before I take this medicine? They need to know if you have any of these conditions: -bleeding problems -fever -immune system problems -low platelet count in the blood -seizures -an unusual or allergic reaction to pneumococcal vaccine, diphtheria toxoid, other vaccines, latex, other medicines, foods, dyes, or preservatives -pregnant or trying to get pregnant -breast-feeding How should I use this medicine? This vaccine is for injection into a muscle. It is given by a health care professional. A copy of Vaccine Information Statements will be given before each vaccination. Read this sheet carefully each time. The sheet may change frequently. Talk to your pediatrician regarding the use of this medicine in children. While this drug may be prescribed for children as young as 97 weeks old for selected conditions, precautions do apply. Overdosage: If you think you have taken too much of this medicine contact a poison control center or emergency room at once. NOTE: This medicine  is only for you. Do not share this medicine with others. What if I miss a dose? It is important not to miss your dose. Call your doctor or health care professional if you are unable to keep an appointment. What may interact with this medicine? -medicines for cancer chemotherapy -medicines that suppress your immune function -medicines that treat or prevent blood clots like warfarin, enoxaparin, and dalteparin -steroid medicines like prednisone or cortisone This list may not describe all possible interactions. Give your health care provider a list of all the medicines, herbs, non-prescription drugs, or dietary supplements you use. Also tell them if you smoke, drink alcohol, or use illegal drugs. Some items may interact with your medicine. What should I watch for while using this medicine? Mild fever and pain should go away in 3 days or less. Report any unusual symptoms to your doctor or health care professional. What side effects may I notice from receiving this medicine? Side effects that you should report to your doctor or health care professional as soon as possible: -allergic reactions like skin rash, itching or hives, swelling of the face, lips, or tongue -breathing problems -confused -fever over 102 degrees F -pain, tingling, numbness in the hands or feet -seizures -unusual bleeding or bruising -unusual muscle weakness Side effects that usually do not require medical attention (report to your doctor or health care professional if they continue or are bothersome): -aches and pains -diarrhea -fever of 102 degrees F or less -headache -irritable -loss of appetite -pain, tender at site where injected -trouble sleeping This list may not describe all possible side effects. Call your  doctor for medical advice about side effects. You may report side effects to FDA at 1-800-FDA-1088. Where should I keep my medicine? This does not apply. This vaccine is given in a clinic, pharmacy, doctor's  office, or other health care setting and will not be stored at home. NOTE: This sheet is a summary. It may not cover all possible information. If you have questions about this medicine, talk to your doctor, pharmacist, or health care provider.  2015, Elsevier/Gold Standard. (2008-04-24 10:17:22)

## 2014-11-09 NOTE — Progress Notes (Addendum)
Multiple concerns Subjective:  Patient ID: Brian Mueller, male    DOB: [birth year]  Age: 69 y.o. MRN: [id number]  Patient's left ear is bothering him. He thinks it may be wax or water in it. He has had cerumen impaction problems in the past.  He has allergic rhinitis, primarily springtime seasonal, and needs a refill of his Flonase  He has erectile dysfunction, and would like a refill on his Levitra   Objective:   Left ear is occluded with cerumen, a hard looking plug. The right ear has a plug also but it is only partially involving the canal, and drum can be well visualized. Neck supple without nodes.  Assessment & Plan:   Assessment:  Cerumen impaction left ear Cerumen right ear Allergic rhinitis Erectile dysfunction Need for vaccinations including pneumococcus Prevnar 13 and influenza  Plan:  Irrigate ears Refill medications for Flonase and Levitra   the medical assistant irrigated a large amount of wax out of the ear, but there still debris left in both ears. I did further irrigation , getting some of the remaining debris out. The patient can hear well.  Patient Instructions  You will receive the Prevnar 13 vaccination today. In one year you should get the Pneumovax 23.  In order to minimize earwax buildup, I recommend that every 3-4 months you use Debrox which you can buy over-the-counter to try and keep ears clean out prophylactically.  Continue the Levitra as needed    Mueller,DAVID, MD 11/09/2014

## 2014-12-04 ENCOUNTER — Other Ambulatory Visit: Payer: Self-pay | Admitting: Family Medicine

## 2014-12-09 ENCOUNTER — Other Ambulatory Visit: Payer: Self-pay | Admitting: Emergency Medicine

## 2015-01-31 ENCOUNTER — Other Ambulatory Visit: Payer: Self-pay | Admitting: Emergency Medicine

## 2015-02-01 ENCOUNTER — Other Ambulatory Visit: Payer: Self-pay | Admitting: Physician Assistant

## 2015-02-12 ENCOUNTER — Ambulatory Visit (INDEPENDENT_AMBULATORY_CARE_PROVIDER_SITE_OTHER): Payer: Medicare Other | Admitting: Family Medicine

## 2015-02-12 VITALS — BP 138/82 | HR 75 | Temp 98.4°F | Resp 18 | Ht 68.0 in | Wt 173.0 lb

## 2015-02-12 DIAGNOSIS — J069 Acute upper respiratory infection, unspecified: Secondary | ICD-10-CM

## 2015-02-12 DIAGNOSIS — R05 Cough: Secondary | ICD-10-CM

## 2015-02-12 DIAGNOSIS — R059 Cough, unspecified: Secondary | ICD-10-CM

## 2015-02-12 MED ORDER — AMOXICILLIN-POT CLAVULANATE 875-125 MG PO TABS: 1.0000 | ORAL_TABLET | Freq: Two times a day (BID) | ORAL | Status: DC

## 2015-02-12 MED ORDER — BENZONATATE 200 MG PO CAPS: 200.0000 mg | ORAL_CAPSULE | Freq: Two times a day (BID) | ORAL | Status: DC | PRN

## 2015-02-12 MED ORDER — HYDROCOD POLST-CPM POLST ER 10-8 MG/5ML PO SUER: 5.0000 mL | Freq: Two times a day (BID) | ORAL | Status: DC

## 2015-02-12 NOTE — Progress Notes (Signed)
Chief Complaint:  Chief Complaint  Patient presents with  . URI    x1 week, nasal congestion.     HPI: Brian Mueller is a 69 y.o. male who reports to [facility] today complaining of 1 week of URI sxs Has had cough, no sinus pressure or chest congestion, no fevers or chills , no ear pain.  No rashes or dairrhea. HE is going to [place] for his 2nd anniversary with his wife and wants to make sure he is well before he goes.   Past Medical History  Diagnosis Date  . Seasonal allergies   . GERD (gastroesophageal reflux disease)   . Erectile dysfunction   . Colon polyp   . Allergy    History reviewed. No pertinent past surgical history. Social History   Social History  . Marital Status: Married    Spouse Name: N/A  . Number of Children: N/A  . Years of Education: N/A   Occupational History  . 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 Smoking status: Former Smoker -- 0.50 packs/day for 6 years    Types: Cigarettes    Quit date: 02/24/1995  . Smokeless tobacco: None  . Alcohol Use: 0.0 oz/week    0 Standard drinks or equivalent per week     Comment: occasional 1 time a month  . Drug Use: No  . Sexual Activity: Not Asked   Other Topics Concern  . None   Social History Narrative   Family History  Problem Relation Age of Onset  . Heart disease Father   . Allergies Father    No Known Allergies Prior to Admission medications   Medication Sig Start Date End Date Taking? Authorizing Provider  aspirin 81 MG tablet Take 81 mg by mouth daily.     Yes Historical Provider, MD  esomeprazole (NEXIUM) 40 MG capsule TAKE 1 CAPSULE BY MOUTH DAILY 02/01/15  Yes Wendie Agreste, MD  fexofenadine (ALLEGRA) 180 MG tablet Take 180 mg by mouth daily.     Yes Historical Provider, MD  fish oil-omega-3 fatty acids 1000 MG capsule 1 capsule twice a day    Yes Historical Provider, MD  fluticasone (FLONASE) 50  MCG/ACT nasal spray Place 2 sprays into the nose daily. 11/09/14  Yes Posey Boyer, MD  fluticasone [facility]) 50 MCG/ACT nasal spray PLACE 2 SPRAYS INTO THE NOSE DAILY 02/04/15  Yes Posey Boyer, MD  Multiple Vitamin (MULTIVITAMIN) capsule Take 1 capsule by mouth daily.     Yes Historical Provider, MD  omeprazole (PRILOSEC OTC) 20 MG tablet 1 capsule every other day    Yes Historical Provider, MD  vardenafil (LEVITRA) 20 MG tablet Take 0.5-1 tablets (10-20 mg total) by mouth daily as needed. Prior to sexual activity. 11/09/14  Yes Posey Boyer, MD  benzonatate (TESSALON) 200 MG capsule Take 1 capsule (200 mg total) by mouth 2 (two) times daily as needed for cough. Patient not taking: Reported on 11/09/2014 07/04/14   Robyn Haber, MD  chlorpheniramine-HYDROcodone [facility]) 10-8 MG/5ML SUER Take 5 mLs by mouth 2 (two) times daily. Patient not taking: Reported on 11/09/2014 06/23/14   Roselee Culver, MD  clarithromycin (BIAXIN) 500 MG tablet Take 1 tablet (500 mg total) by mouth 2 (two) times daily. Patient not taking: Reported on 11/09/2014 06/23/14   Roselee Culver, MD  ROS: The patient denies fevers, chills, night sweats, unintentional weight loss, chest pain, palpitations, wheezing, dyspnea on exertion, nausea, vomiting, abdominal pain, dysuria, hematuria, melena, numbness, weakness, or tingling.  All other systems have been reviewed and were otherwise negative with the exception of those mentioned in the HPI and as above.    PHYSICAL EXAM: Filed Vitals:   02/12/15 1346  BP: 138/82  Pulse: 75  Temp: 98.4 F (36.9 C)  Resp: 18   Body mass index is 26.31 kg/(m^2).   General: Alert, no acute distress HEENT:  Normocephalic, atraumatic, oropharynx patent. EOMI, PERRLA Erythematous throat, no exudates, TM normal, +/- sinus tenderness, + erythematous/boggy nasal mucosa Cardiovascular:  Regular rate and rhythm, no rubs murmurs or gallops.  No Carotid bruits,  radial pulse intact. No pedal edema.  Respiratory: Clear to auscultation bilaterally.  No wheezes, rales, or rhonchi.  No cyanosis, no use of accessory musculature Abdominal: No organomegaly, abdomen is soft and non-tender, positive bowel sounds. No masses. Skin: No rashes. Neurologic: Facial musculature symmetric. Psychiatric: Patient acts appropriately throughout our interaction. Lymphatic: No cervical or submandibular lymphadenopathy Musculoskeletal: Gait intact. No edema, tenderness   LABS: Results for orders placed or performed in visit on 07/29/10  Pulmonary function test  Result Value Ref Range   FEV1  liters   FVC  liters   FEV1/FVC  %   TLC  liters   DLCO  ml/mmHg sec     EKG/XRAY:   Primary read interpreted by Dr. Marin Comment at [facility].   ASSESSMENT/PLAN: Encounter Diagnoses  Name Primary?  . Acute upper respiratory infection Yes  . Viral URI   . Cough    Rx tessalon perles, tussionex Rx augmentin, if no improvement then may take since going on vacation  Fu prn   Gross sideeffects, risk and benefits, and alternatives of medications d/w patient. Patient is aware that all medications have potential sideeffects and we are unable to predict every sideeffect or drug-drug interaction that may occur.  Lus Kriegel DO  02/12/2015 4:30 PM

## 2015-02-27 ENCOUNTER — Encounter: Payer: Self-pay | Admitting: Gastroenterology

## 2015-03-04 ENCOUNTER — Encounter: Payer: Medicare Other | Admitting: Family Medicine

## 2015-03-11 ENCOUNTER — Encounter: Payer: Medicare Other | Admitting: Family Medicine

## 2015-03-30 ENCOUNTER — Other Ambulatory Visit: Payer: Self-pay | Admitting: Family Medicine

## 2015-05-05 ENCOUNTER — Other Ambulatory Visit: Payer: Self-pay | Admitting: Physician Assistant

## 2015-06-05 ENCOUNTER — Other Ambulatory Visit: Payer: Self-pay | Admitting: Physician Assistant

## 2015-06-23 IMAGING — CR DG CHEST 2V
2 series · 2 of 2 positions shown · non-contrast
Comparison: December 12, 2012

CLINICAL DATA: Shortness of Breath

EXAM:
CHEST  2 VIEW

[PA]
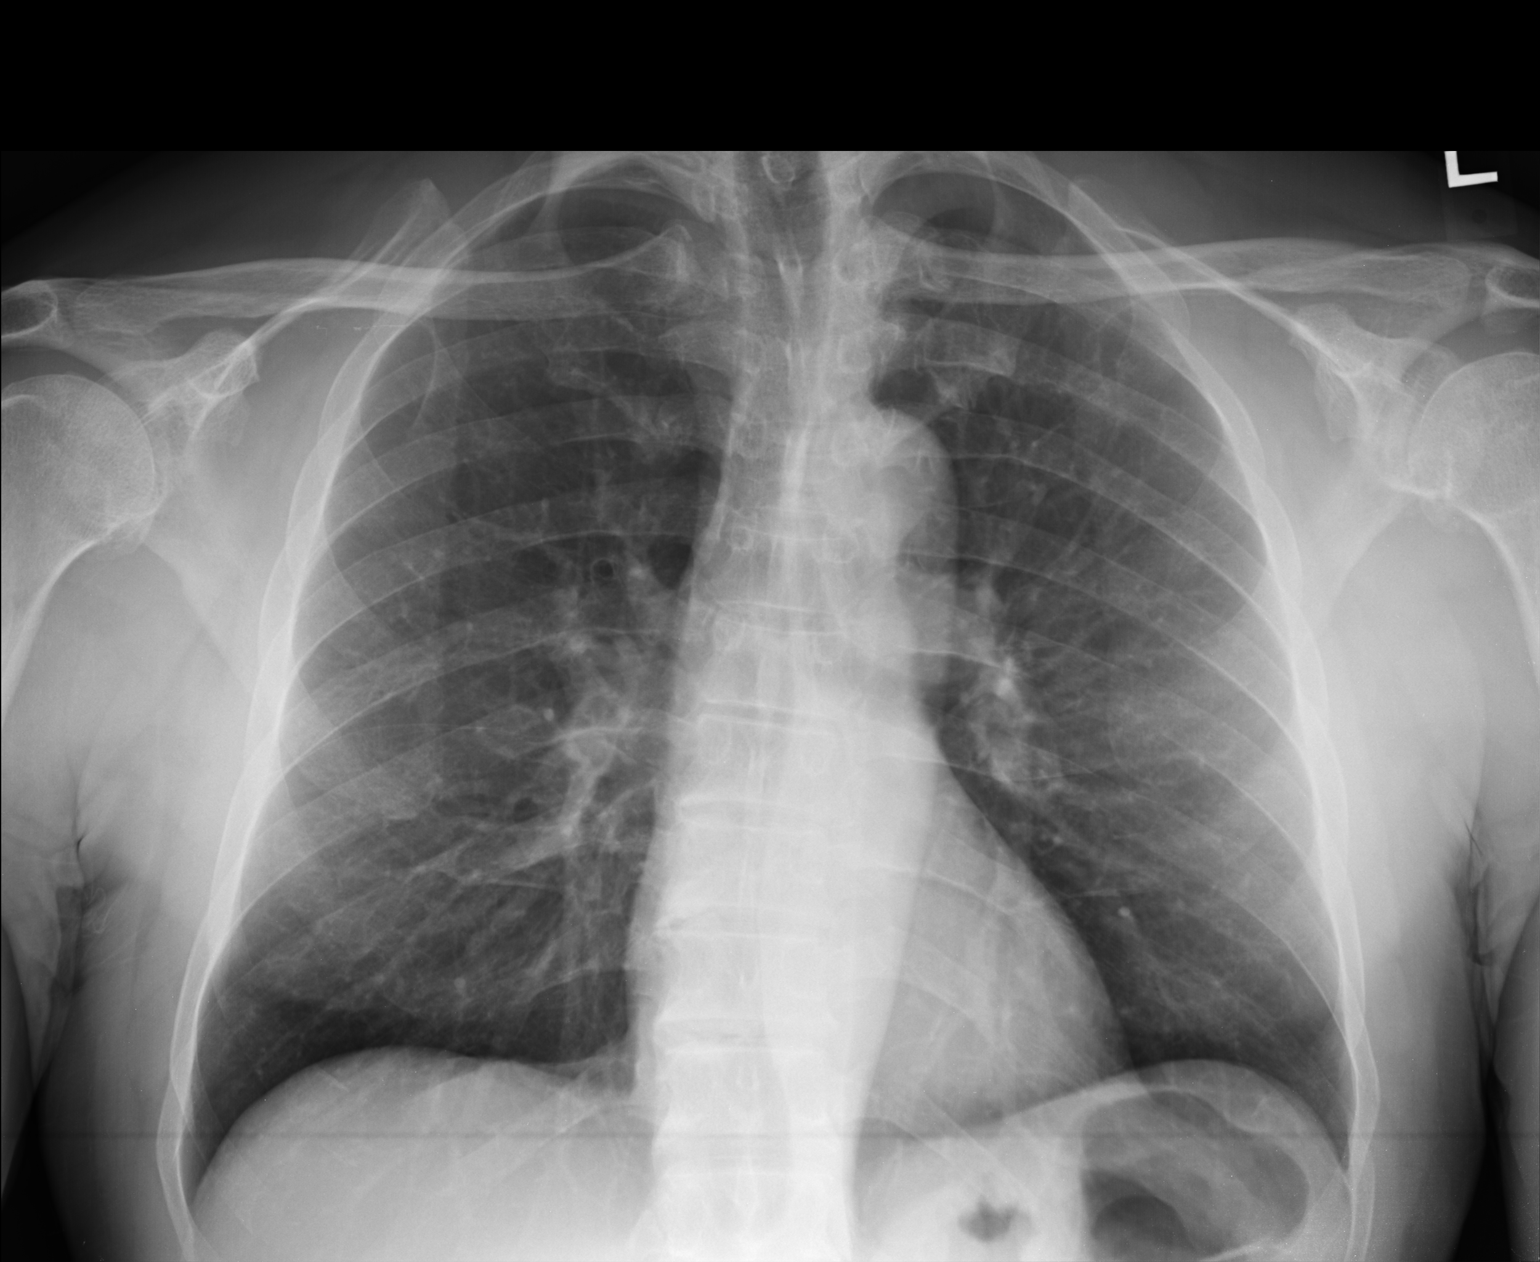

[lateral]
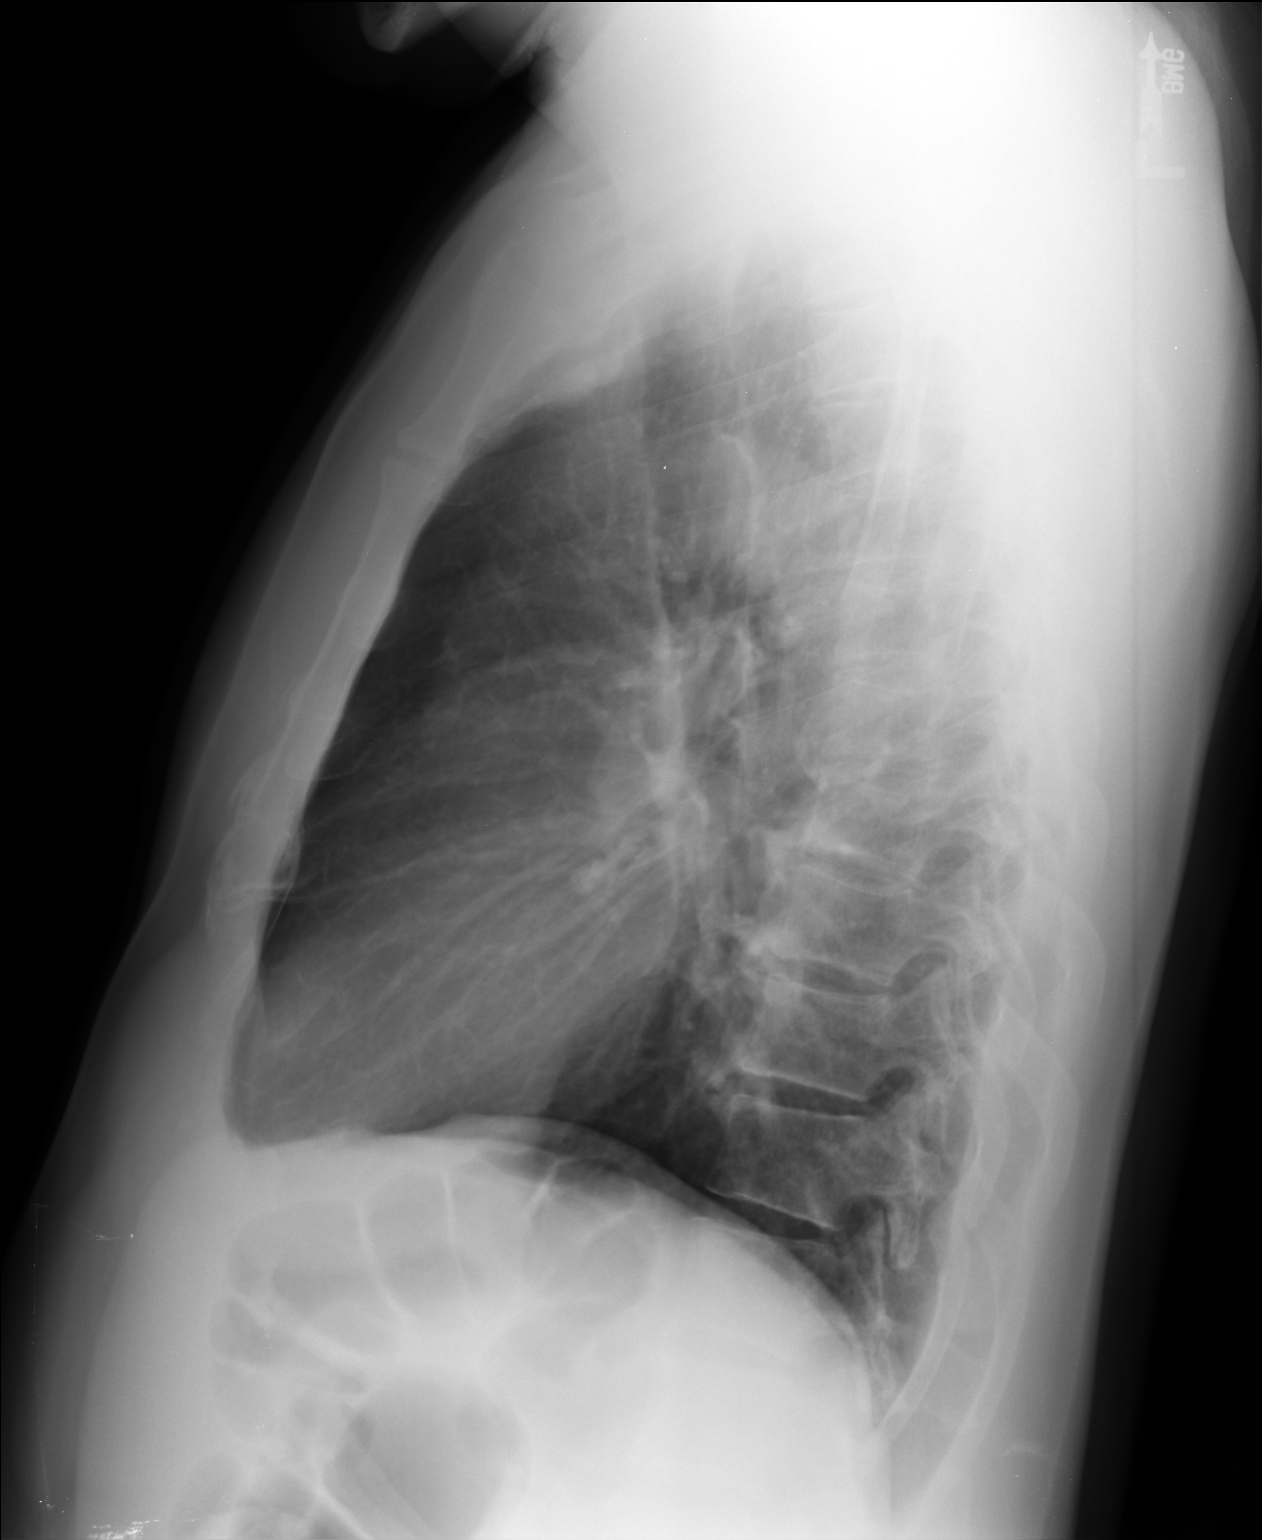

[2 of 2 positions shown; findings below may reference images not displayed]

FINDINGS: Lungs are clear. The heart size and pulmonary vascularity are
normal. No adenopathy. There is upper thoracic levoscoliosis.
IMPRESSION: No edema or consolidation.

## 2015-07-13 ENCOUNTER — Other Ambulatory Visit: Payer: Self-pay | Admitting: Family Medicine

## 2015-10-01 ENCOUNTER — Other Ambulatory Visit: Payer: Self-pay | Admitting: Family Medicine

## 2016-04-09 ENCOUNTER — Other Ambulatory Visit: Payer: Self-pay | Admitting: Surgery

## 2016-07-17 ENCOUNTER — Other Ambulatory Visit: Payer: Self-pay | Admitting: Family Medicine

## 2016-07-17 DIAGNOSIS — J309 Allergic rhinitis, unspecified: Secondary | ICD-10-CM

## 2018-08-12 ENCOUNTER — Encounter: Payer: Self-pay | Admitting: Gastroenterology

## 2018-09-06 ENCOUNTER — Encounter: Payer: Self-pay | Admitting: Gastroenterology

## 2018-09-26 ENCOUNTER — Other Ambulatory Visit: Payer: Self-pay

## 2018-09-26 ENCOUNTER — Ambulatory Visit: Payer: Medicare Other | Admitting: *Deleted

## 2018-09-26 VITALS — Ht 68.0 in | Wt 172.0 lb

## 2018-09-26 DIAGNOSIS — Z1211 Encounter for screening for malignant neoplasm of colon: Secondary | ICD-10-CM

## 2018-09-26 MED ORDER — PEG 3350-KCL-NA BICARB-NACL 420 G PO SOLR: 4000.0000 mL | Freq: Once | ORAL | 0 refills | Status: AC

## 2018-09-26 NOTE — Progress Notes (Signed)

## 2018-10-07 ENCOUNTER — Telehealth: Payer: Self-pay | Admitting: Gastroenterology

## 2018-10-07 NOTE — Telephone Encounter (Signed)

## 2018-10-10 ENCOUNTER — Ambulatory Visit (AMBULATORY_SURGERY_CENTER): Payer: Medicare Other | Admitting: Gastroenterology

## 2018-10-10 ENCOUNTER — Encounter: Payer: Self-pay | Admitting: Gastroenterology

## 2018-10-10 ENCOUNTER — Other Ambulatory Visit: Payer: Self-pay

## 2018-10-10 VITALS — BP 126/78 | HR 67 | Temp 98.2°F | Resp 18 | Ht 68.0 in | Wt 172.0 lb

## 2018-10-10 DIAGNOSIS — K635 Polyp of colon: Secondary | ICD-10-CM

## 2018-10-10 DIAGNOSIS — Z1211 Encounter for screening for malignant neoplasm of colon: Secondary | ICD-10-CM | POA: Diagnosis not present

## 2018-10-10 DIAGNOSIS — D125 Benign neoplasm of sigmoid colon: Secondary | ICD-10-CM

## 2018-10-10 MED ORDER — SODIUM CHLORIDE 0.9 % IV SOLN: 500.0000 mL | Freq: Once | INTRAVENOUS | Status: DC

## 2018-10-10 NOTE — Progress Notes (Signed)
Report given to PACU, vss 

## 2018-10-10 NOTE — Patient Instructions (Signed)
Read all of the handouts given to you by your recovery room nurse.  Thank-you for choosing [place] for your healthcare needs today.  YOU HAD AN ENDOSCOPIC PROCEDURE TODAY AT [facility]:   Refer to the procedure report that was given to you for any specific questions about what was found during the examination.  If the procedure report does not answer your questions, please call your gastroenterologist to clarify.  If you requested that your care partner not be given the details of your procedure findings, then the procedure report has been included in a sealed envelope for you to review at your convenience later.  YOU SHOULD EXPECT: Some feelings of bloating in the abdomen. Passage of more gas than usual.  Walking can help get rid of the air that was put into your GI tract during the procedure and reduce the bloating. If you had a lower endoscopy (such as a colonoscopy or flexible sigmoidoscopy) you may notice spotting of blood in your stool or on the toilet paper. If you underwent a bowel prep for your procedure, you may not have a normal bowel movement for a few days.  Please Note:  You might notice some irritation and congestion in your nose or some drainage.  This is from the oxygen used during your procedure.  There is no need for concern and it should clear up in a day or so.  SYMPTOMS TO REPORT IMMEDIATELY:   Following lower endoscopy (colonoscopy or flexible sigmoidoscopy):  Excessive amounts of blood in the stool  Significant tenderness or worsening of abdominal pains  Swelling of the abdomen that is new, acute  Fever of 100F or higher   For urgent or emergent issues, a gastroenterologist can be reached at any hour by calling [phone].   DIET:  We do recommend a small meal at first, but then you may proceed to your regular diet.  Drink plenty of fluids but you should avoid alcoholic beverages for 24 hours. Try to increase the fiber in your diet, and drink plenty of  water.  ACTIVITY:  You should plan to take it easy for the rest of today and you should NOT DRIVE or use heavy machinery until tomorrow (because of the sedation medicines used during the test).    FOLLOW UP: Our staff will call the number listed on your records 48-72 hours following your procedure to check on you and address any questions or concerns that you may have regarding the information given to you following your procedure. If we do not reach you, we will leave a message.  We will attempt to reach you two times.  During this call, we will ask if you have developed any symptoms of COVID 19. If you develop any symptoms (ie: fever, flu-like symptoms, shortness of breath, cough etc.) before then, please call [phone].  If you test positive for Covid 19 in the 2 weeks post procedure, please call and report this information to [place].    If any biopsies were taken you will be contacted by phone or by letter within the next 1-3 weeks.  Please call us at [phone] if you have not heard about the biopsies in 3 weeks.    SIGNATURES/CONFIDENTIALITY: You and/or your care partner have signed paperwork which will be entered into your electronic medical record.  These signatures attest to the fact that that the information above on your After Visit Summary has been reviewed and is understood.  Full responsibility of the confidentiality  of this discharge information lies with you and/or your care-partner.

## 2018-10-10 NOTE — Progress Notes (Signed)
Pt's states no medical or surgical changes since previsit or office visit.  Temp per June VS per Courtney 

## 2018-10-10 NOTE — Op Note (Signed)
[place] Patient Name: Brian Mueller Procedure Date: 10/10/2018 9:18 AM MRN: [id number] Endoscopist: Milus Banister , MD Age: 73 Referring MD:  Date of Birth: 1945-10-04 Gender: Male Account #: [id number] Procedure:                Colonoscopy Indications:              Screening for colorectal malignant neoplasm; very                            remote adenoma (1990s) Medicines:                Monitored Anesthesia Care Procedure:                Pre-Anesthesia Assessment:                           - Prior to the procedure, a History and Physical                            was performed, and patient medications and                            allergies were reviewed. The patient's tolerance of                            previous anesthesia was also reviewed. The risks                            and benefits of the procedure and the sedation                            options and risks were discussed with the patient.                            All questions were answered, and informed consent                            was obtained. Prior Anticoagulants: The patient has                            taken no previous anticoagulant or antiplatelet                            agents. ASA Grade Assessment: II - A patient with                            mild systemic disease. After reviewing the risks                            and benefits, the patient was deemed in                            satisfactory condition to undergo the procedure.  After obtaining informed consent, the colonoscope                            was passed under direct vision. Throughout the                            procedure, the patient's blood pressure, pulse, and                            oxygen saturations were monitored continuously. The                            Colonoscope was introduced through the anus and                            advanced to the the cecum, identified by                           appendiceal orifice and ileocecal valve. The                            colonoscopy was performed without difficulty. The                            patient tolerated the procedure well. The quality                            of the bowel preparation was good. The ileocecal                            valve, appendiceal orifice, and rectum were                            photographed. Scope In: 9:20:47 AM Scope Out: 9:31:59 AM Scope Withdrawal Time: 0 hours 8 minutes 9 seconds  Total Procedure Duration: 0 hours 11 minutes 12 seconds  Findings:                 A 3 mm polyp was found in the sigmoid colon. The                            polyp was sessile. The polyp was removed with a                            cold snare. Resection and retrieval were complete.                           Multiple small-mouthed diverticula were found in                            the left colon.                           The exam was otherwise without abnormality on  direct and retroflexion views. Complications:            No immediate complications. Estimated blood loss:                            None. Estimated Blood Loss:     Estimated blood loss: none. Impression:               - One 3 mm polyp in the sigmoid colon, removed with                            a cold snare. Resected and retrieved.                           - Diverticulosis in the left colon.                           - The examination was otherwise normal on direct                            and retroflexion views. Recommendation:           - Patient has a contact number available for                            emergencies. The signs and symptoms of potential                            delayed complications were discussed with the                            patient. Return to normal activities tomorrow.                            Written discharge instructions were provided to the                             patient.                           - Resume previous diet.                           - Continue present medications.                           You will receive a letter within 2-3 weeks with the                            pathology results and my final recommendations.                           If the polyp(s) is proven to be 'pre-cancerous' on                            pathology, you will need repeat colonoscopy in 7  years. Milus Banister, MD 10/10/2018 9:34:44 AM This report has been signed electronically.

## 2018-10-12 ENCOUNTER — Telehealth: Payer: Self-pay

## 2018-10-12 NOTE — Telephone Encounter (Signed)
  Follow up Call-  Call back number 10/10/2018  Post procedure Call Back phone  # [phone]  Permission to leave phone message Yes  Some recent data might be hidden     Patient questions:  Do you have a fever, pain , or abdominal swelling? No. Pain Score  0 *  Have you tolerated food without any problems? Yes.    Have you been able to return to your normal activities? Yes.    Do you have any questions about your discharge instructions: Diet   No. Medications  No. Follow up visit  No.  Do you have questions or concerns about your Care? No.  Actions: * If pain score is 4 or above: No action needed, pain <4.   1. Have you developed a fever since your procedure? no  2.   Have you had an respiratory symptoms (SOB or cough) since your procedure? no  3.   Have you tested positive for COVID 19 since your procedure no  4.   Have you had any family members/close contacts diagnosed with the COVID 19 since your procedure?  no   If yes to any of these questions please route to Joylene John, RN and Alphonsa Gin, Therapist, sports.

## 2018-10-13 ENCOUNTER — Encounter: Payer: Self-pay | Admitting: Gastroenterology

## 2019-08-09 ENCOUNTER — Ambulatory Visit (INDEPENDENT_AMBULATORY_CARE_PROVIDER_SITE_OTHER): Payer: Medicare Other | Admitting: Gastroenterology

## 2019-08-09 ENCOUNTER — Encounter: Payer: Self-pay | Admitting: Gastroenterology

## 2019-08-09 VITALS — BP 128/72 | HR 72 | Ht 68.0 in | Wt 171.6 lb

## 2019-08-09 DIAGNOSIS — K219 Gastro-esophageal reflux disease without esophagitis: Secondary | ICD-10-CM

## 2019-08-09 NOTE — Patient Instructions (Addendum)
If you are age 72 or older, your body mass index should be between 23-30. Your Body mass index is 26.09 kg/m. If this is out of the aforementioned range listed, please consider follow up with your Primary Care Provider.  If you are age 64 or younger, your body mass index should be between 19-25. Your Body mass index is 26.09 kg/m. If this is out of the aformentioned range listed, please consider follow up with your Primary Care Provider.   We have requested pathology report from [facility].  Please stop NSAID's and Aspirin.  It is safe for you to use Tylenol.  Please make sure to take your PPI (omeprazole) 20-30 minutes before breakfast meal each day.  Thank you for entrusting me with your care and choosing [facility].  Dr Ardis Hughs

## 2019-08-09 NOTE — Progress Notes (Signed)
HPI: This is a very pleasant 74 year old man who is following up after an EGD last week in [facility].  He tells me that he has had GERD for years.  He takes either Nexium or Protonix once daily about an hour before breakfast.  On this regimen he feels quite well.  He entered a study at the "[facility] [employer] in [place].  They performed an upper endoscopy for him last week.  This showed esophagitis, a small hiatal hernia, gastritis and multiple ulcers were found in the duodenum.  I do not have any of the path results from that.  He was told to follow-up in research in 1 to 2 weeks and to follow-up with GI in 3 weeks.  He was then told he was kicked out of the research study.  Old Data Reviewed:  I last saw him at the time of a colonoscopy about a year ago.  That was for routine colon cancer screening.  He did have a very remote colon adenoma removed in the 1990s.  August 2020 colonoscopy 3 mm polyp was removed from the colon.  There was also left-sided diverticulosis.  The polyp was hyperplastic and I recommended repeat colonoscopy at 10-year interval if still relevant given his overall health, he would be in his early 71s.  Review of systems: Pertinent positive and negative review of systems were noted in the above HPI section. All other review negative.   Past Medical History:  Diagnosis Date  . Allergy   . Colon polyp   . Erectile dysfunction   . GERD (gastroesophageal reflux disease)   . Seasonal allergies     Past Surgical History:  Procedure Laterality Date  . COLONOSCOPY    . cyst removed from back of shoulder     4 years ago    Current Outpatient Medications  Medication Sig Dispense Refill  . aspirin 81 MG tablet Take 81 mg by mouth daily.      Marland Kitchen esomeprazole (NEXIUM) 40 MG capsule TAKE 1 CAPSULE BY MOUTH DAILY 30 capsule 0  . fexofenadine (ALLEGRA) 180 MG tablet Take 180 mg by mouth daily.      . fish oil-omega-3 fatty acids 1000 MG capsule 1 capsule twice a  day     . fluticasone (FLONASE) 50 MCG/ACT nasal spray Place 2 sprays into the nose daily. 48 g 5  . LEVITRA 20 MG tablet TAKE 0.5-1 TABLETS (10-20 MG TOTAL) BY MOUTH DAILY AS NEEDED. PRIOR TO SEXUAL ACTIVITY. 10 tablet 0  . Multiple Vitamin (MULTIVITAMIN) capsule Take 1 capsule by mouth daily.      Marland Kitchen omeprazole (PRILOSEC OTC) 20 MG tablet 1 capsule every other day      No current facility-administered medications for this visit.    Allergies as of 08/09/2019  . (No Known Allergies)    Family History  Problem Relation Age of Onset  . Heart disease Father   . Allergies Father   . Colon cancer Neg Hx   . Colon polyps Neg Hx   . Esophageal cancer Neg Hx   . Rectal cancer Neg Hx   . Stomach cancer Neg Hx     Social History   Socioeconomic History  . Marital status: Married    Spouse name: Not on file  . Number of children: Not on file  . Years of education: Not on file  . Highest education level: Not on file  Occupational History  . Occupation: Pharmacist, hospital    Comment: [employer] GED  classed  . Occupation: retired    Comment: [place]  Tobacco Use  . Smoking status: Former Smoker    Packs/day: 0.50    Years: 6.00    Pack years: 3.00    Types: Cigarettes    Quit date: 02/24/1995    Years since quitting: 24.4  . Smokeless tobacco: Never Used  Substance and Sexual Activity  . Alcohol use: Yes    Alcohol/week: 0.0 standard drinks    Comment: occasional 1 time a month  . Drug use: No  . Sexual activity: Not on file  Other Topics Concern  . Not on file  Social History Narrative  . Not on file   Social Determinants of Health   Financial Resource Strain:   . Difficulty of Paying Living Expenses:   Food Insecurity:   . Worried About Charity fundraiser in the Last Year:   . Arboriculturist in the Last Year:   Transportation Needs:   . Film/video editor (Medical):   Marland Kitchen Lack of Transportation (Non-Medical):   Physical Activity:   . Days of  Exercise per Week:   . Minutes of Exercise per Session:   Stress:   . Feeling of Stress :   Social Connections:   . Frequency of Communication with Friends and Family:   . Frequency of Social Gatherings with Friends and Family:   . Attends Religious Services:   . Active Member of Clubs or Organizations:   . Attends Archivist Meetings:   Marland Kitchen Marital Status:   Intimate Partner Violence:   . Fear of Current or Ex-Partner:   . Emotionally Abused:   Marland Kitchen Physically Abused:   . Sexually Abused:      Physical Exam: BP 128/72   Pulse 72   Ht 5\' 8"  (1.727 m)   Wt 171 lb 9.6 oz (77.8 kg)   SpO2 97%   BMI 26.09 kg/m  Constitutional: generally well-appearing Psychiatric: alert and oriented x3 Eyes: extraocular movements intact Mouth: oral pharynx moist, no lesions Neck: supple no lymphadenopathy Cardiovascular: heart regular rate and rhythm Lungs: clear to auscultation bilaterally Abdomen: soft, nontender, nondistended, no obvious ascites, no peritoneal signs, normal bowel sounds Extremities: no lower extremity edema bilaterally Skin: no lesions on visible extremities   Assessment and plan: 74 y.o. male with GERD, esophagitis, likely NSAID related damage to the stomach and upper intestine  First he had an upper endoscopy at the "[facility]" in [place] through the "[employer] research division."    They performed an upper endoscopy for him, noted several findings and then fired him from the research protocol.  We will get pathology results from that procedure.  Their GI group is notoriously difficult to get records from and so hopefully the records will not be delayed too long.  I will contact him after reviewing the path results.  I explained to him that he should completely avoid NSAIDs and aspirin and take Tylenol for his headache pains instead.  I recommend that he start taking his proton pump inhibitor 20 to 30 minutes prior to his breakfast meal rather than 1  hour prior.  Please see the "Patient Instructions" section for addition details about the plan.   Owens Loffler, MD [facility] 08/09/2019, 10:57 AM  Cc: Seward Carol, MD  Total time on date of encounter was 45  minutes (this included time spent preparing to see the patient reviewing records; obtaining and/or reviewing separately obtained history; performing  a medically appropriate exam and/or evaluation; counseling and educating the patient and family if present; ordering medications, tests or procedures if applicable; and documenting clinical information in the health record).

## 2019-11-28 ENCOUNTER — Ambulatory Visit: Payer: Medicare Other | Attending: Internal Medicine

## 2019-11-28 DIAGNOSIS — Z23 Encounter for immunization: Secondary | ICD-10-CM

## 2019-11-28 NOTE — Progress Notes (Signed)
   Covid-19 Vaccination Clinic  Name:  Brian Mueller    MRN: [id number] DOB: [birth year]  11/28/2019  [name] was observed post Covid-19 immunization for 15 minutes without incident. He was provided with Vaccine Information Sheet and instruction to access the V-Safe system.   [name] was instructed to call 911 with any severe reactions post vaccine: Marland Kitchen Difficulty breathing  . Swelling of face and throat  . A fast heartbeat  . A bad rash all over body  . Dizziness and weakness

## 2020-01-12 ENCOUNTER — Telehealth: Payer: Self-pay | Admitting: Gastroenterology

## 2020-01-12 NOTE — Telephone Encounter (Signed)
The pt has been advised and he states he feels fine.  No complaints.  I have called [facility] and asked for the EGD reports to be faxed to Dr Ardis Hughs attention.

## 2020-01-12 NOTE — Telephone Encounter (Signed)
I received partial records from [facility].  The only thing they sent was pathology reports.  These are dated July 28, 2019.  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  Negative for H. pylori."  There are some handwritten notes on the report that read "negative HPV positive duodenal ulcers recommend avoid NSAIDs"   Can you please let this patient know that we have finally received partial records from [place].  The only thing they included where is the pathology report.  Ask how he is feeling.  Can you please also reach out to [place] facility.  We need the actual EGD report, not just the associated pathology and I know that that is what we ask for the first time but they only sent partial records.

## 2022-04-28 ENCOUNTER — Other Ambulatory Visit (HOSPITAL_COMMUNITY): Payer: Self-pay | Admitting: Urology

## 2022-04-28 DIAGNOSIS — N4340 Spermatocele of epididymis, unspecified: Secondary | ICD-10-CM

## 2022-05-01 ENCOUNTER — Ambulatory Visit (HOSPITAL_COMMUNITY)
Admission: RE | Admit: 2022-05-01 | Discharge: 2022-05-01 | Disposition: A | Discharge status: Home / Self Care | Payer: Medicare Other | Source: Ambulatory Visit | Attending: Urology | Admitting: Urology

## 2022-05-01 DIAGNOSIS — N4340 Spermatocele of epididymis, unspecified: Secondary | ICD-10-CM | POA: Diagnosis present
